# Patient Record
Sex: Male | Born: 1985 | Race: White | Hispanic: No | Marital: Single | Smoking: Current every day smoker
Health system: Southern US, Community
[De-identification: ages and names within clinical notes are randomized; demographics above are authoritative.]

## PROBLEM LIST (undated history)

## (undated) DIAGNOSIS — S82899A Other fracture of unspecified lower leg, initial encounter for closed fracture: Secondary | ICD-10-CM

## (undated) DIAGNOSIS — S40812A Abrasion of left upper arm, initial encounter: Secondary | ICD-10-CM

## (undated) DIAGNOSIS — K219 Gastro-esophageal reflux disease without esophagitis: Secondary | ICD-10-CM

---
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
## 2005-10-07 ENCOUNTER — Emergency Department (HOSPITAL_COMMUNITY): Admission: EM | Admit: 2005-10-07 | Discharge: 2005-10-08 | Payer: Self-pay | Admitting: Emergency Medicine

## 2009-01-15 ENCOUNTER — Emergency Department (HOSPITAL_COMMUNITY): Admission: EM | Admit: 2009-01-15 | Discharge: 2009-01-15 | Payer: Self-pay | Admitting: Emergency Medicine

## 2011-09-06 DIAGNOSIS — S82899A Other fracture of unspecified lower leg, initial encounter for closed fracture: Secondary | ICD-10-CM

## 2011-09-06 HISTORY — DX: Other fracture of unspecified lower leg, initial encounter for closed fracture: S82.899A

## 2011-09-07 ENCOUNTER — Emergency Department (HOSPITAL_COMMUNITY): Payer: BC Managed Care – PPO

## 2011-09-07 ENCOUNTER — Emergency Department (HOSPITAL_COMMUNITY)
Admission: EM | Admit: 2011-09-07 | Discharge: 2011-09-07 | Disposition: A | Discharge status: Home / Self Care | Payer: BC Managed Care – PPO | Attending: Emergency Medicine | Admitting: Emergency Medicine

## 2011-09-07 ENCOUNTER — Encounter (HOSPITAL_COMMUNITY): Payer: Self-pay | Admitting: Emergency Medicine

## 2011-09-07 DIAGNOSIS — Y9301 Activity, walking, marching and hiking: Secondary | ICD-10-CM | POA: Insufficient documentation

## 2011-09-07 DIAGNOSIS — K219 Gastro-esophageal reflux disease without esophagitis: Secondary | ICD-10-CM | POA: Insufficient documentation

## 2011-09-07 DIAGNOSIS — S82843A Displaced bimalleolar fracture of unspecified lower leg, initial encounter for closed fracture: Secondary | ICD-10-CM

## 2011-09-07 DIAGNOSIS — W108XXA Fall (on) (from) other stairs and steps, initial encounter: Secondary | ICD-10-CM | POA: Insufficient documentation

## 2011-09-07 DIAGNOSIS — Y998 Other external cause status: Secondary | ICD-10-CM | POA: Insufficient documentation

## 2011-09-07 DIAGNOSIS — F17200 Nicotine dependence, unspecified, uncomplicated: Secondary | ICD-10-CM | POA: Insufficient documentation

## 2011-09-07 HISTORY — DX: Gastro-esophageal reflux disease without esophagitis: K21.9

## 2011-09-07 MED ORDER — PERCOCET 5-325 MG PO TABS: ORAL_TABLET | ORAL | Status: DC

## 2011-09-07 MED ORDER — OXYCODONE-ACETAMINOPHEN 5-325 MG PO TABS
2.0000 | ORAL_TABLET | Freq: Once | ORAL | Status: AC
  Administered 2011-09-07: 2 via ORAL
  Filled 2011-09-07: qty 2

## 2011-09-07 NOTE — ED Notes (Signed)
Pt states he fell down some wooden steps tonight around 10pm  Pt is c/o left ankle pain  Pt has swelling and abrasions noted to his left ankle and arm   Pt denies pain anywhere else other than his ankle

## 2011-09-07 NOTE — ED Notes (Signed)
Ice pack given to pt for comfort

## 2011-09-07 NOTE — ED Provider Notes (Cosign Needed)
History     CSN: [id number]  Arrival date & time 09/07/11  Nicholas Christensen   First MD Initiated Contact with Patient 09/07/11 0103      Chief Complaint  Patient presents with  . Ankle Pain    (Consider location/radiation/quality/duration/timing/severity/associated sxs/prior treatment) HPI  Patient relates he was coming down 3 steps and he tripped and fell injuring his left ankle tonight about 10:30 PM. He denies hitting his head or loss of consciousness. He notes that he has an abrasion on his left forearm but he has no pain there. He states he has a lot of pain and swelling in his left ankle.  Patient reports he is seeing Dr. Darrelyn Hillock  in the past and his father also sees him. They request him if he needs orthopedic care.  PCP none  Past Medical History  Diagnosis Date  . Acid reflux     History reviewed. No pertinent past surgical history.  Family History  Problem Relation Age of Onset  . Diabetes Mother   . Hypertension Father   . Cancer Other   . Stroke Other     History  Substance Use Topics  . Smoking status: Current Everyday Smoker    Types: Cigarettes  . Smokeless tobacco: Not on file  . Alcohol Use: Yes  employed    Review of Systems  All other systems reviewed and are negative.    Allergies  Review of patient's allergies indicates no known allergies.  Home Medications   Current Outpatient Rx  Name Route Sig Dispense Refill  . DIHYDROXYALUMINUM SOD CARB 334 MG PO CHEW Oral Chew 1 tablet by mouth 2 (two) times daily as needed. For heartburn      BP 146/91  Pulse 105  Temp 98 F (36.7 C) (Oral)  Resp 18  SpO2 100%  Vital signs normal except tachycardia   Physical Exam  Nursing note and vitals reviewed. Constitutional: He is oriented to person, place, and time. He appears well-developed and well-nourished.  Non-toxic appearance. He does not appear ill. No distress.  HENT:  Head: Normocephalic and atraumatic.  Nose: Nose normal. No mucosal edema  or rhinorrhea.  Mouth/Throat: Mucous membranes are normal. No dental abscesses or uvula swelling.  Eyes: Conjunctivae and EOM are normal. Pupils are equal, round, and reactive to light.  Neck: Normal range of motion and full passive range of motion without pain. Neck supple.  Pulmonary/Chest: Effort normal. No respiratory distress. He has no rhonchi. He exhibits no crepitus.  Abdominal: Normal appearance.  Musculoskeletal: Normal range of motion. He exhibits edema and tenderness.       Patient is noted to have some superficial abrasions on his left forearm from his fall. He has no pain on range of motion of his elbow or wrist. Patient's noted to have moderate swelling of his left ankle both medial and laterally. He has intact pulses. He has normal color of his toes with normal capillary refill. He is nontender to palpation in his knee or his lower leg. He has a superficial abrasion over the lateral malleolus.  Neurological: He is alert and oriented to person, place, and time. He has normal strength. No cranial nerve deficit.  Skin: Skin is warm, dry and intact. No rash noted. No erythema. No pallor.  Psychiatric: He has a normal mood and affect. His speech is normal and behavior is normal. His mood appears not anxious.    ED Course  Procedures (including critical care time)   Medications  oxyCODONE-acetaminophen (PERCOCET/ROXICET) 5-325  MG per tablet 2 tablet (2 tablet Oral Given 09/07/11 0203)   Pt placed in post/stirrup splint and crutches  02:01 Dr Darrelyn Hillock put in well padded splint and come to the office around 9 am today.   Labs Reviewed - No data to display Dg Ankle Complete Left  09/07/2011  *RADIOLOGY REPORT*  Clinical Data: Fall  LEFT ANKLE COMPLETE - 3+ VIEW  Comparison: None.  Findings: There is an oblique fracture through the lateral malleolus, extending from the tibial plafond posteriorly and superiorly.  There is widening of the medial ankle mortise. Displaced intra-articular  posterior malleolar fracture.  Medial malleolus is intact.  IMPRESSION: Acute fractures of the lateral malleolus and posterior malleolus are associated with disruption of the ankle mortise.  Original Report Authenticated By: Donavan Burnet, M.D.     1. Bimalleolar ankle fracture    New Prescriptions   PERCOCET 5-325 MG PER TABLET    Take 1 or 2 po Q 6hrs for pain    Plan discharge  Devoria Albe, MD, FACEP   MDM          Ward Givens, MD 09/07/11 [phone]

## 2011-09-09 ENCOUNTER — Ambulatory Visit
Admission: RE | Admit: 2011-09-09 | Discharge: 2011-09-09 | Disposition: A | Discharge status: Home / Self Care | Payer: BC Managed Care – PPO | Source: Ambulatory Visit | Attending: Orthopedic Surgery | Admitting: Orthopedic Surgery

## 2011-09-09 ENCOUNTER — Other Ambulatory Visit: Payer: Self-pay | Admitting: Orthopedic Surgery

## 2011-09-09 ENCOUNTER — Encounter (HOSPITAL_COMMUNITY): Payer: Self-pay | Admitting: Pharmacy Technician

## 2011-09-09 DIAGNOSIS — T148XXA Other injury of unspecified body region, initial encounter: Secondary | ICD-10-CM

## 2011-09-10 ENCOUNTER — Encounter (HOSPITAL_COMMUNITY)
Admission: RE | Admit: 2011-09-10 | Discharge: 2011-09-10 | Disposition: A | Discharge status: Home / Self Care | Payer: BC Managed Care – PPO | Source: Ambulatory Visit | Attending: Orthopedic Surgery | Admitting: Orthopedic Surgery

## 2011-09-10 ENCOUNTER — Encounter (HOSPITAL_COMMUNITY): Payer: Self-pay

## 2011-09-10 HISTORY — DX: Other fracture of unspecified lower leg, initial encounter for closed fracture: S82.899A

## 2011-09-10 HISTORY — DX: Abrasion of left upper arm, initial encounter: S40.812A

## 2011-09-10 LAB — URINALYSIS, ROUTINE W REFLEX MICROSCOPIC
Glucose, UA: NEGATIVE mg/dL
Hgb urine dipstick: NEGATIVE
Ketones, ur: NEGATIVE mg/dL
Leukocytes, UA: NEGATIVE
Nitrite: NEGATIVE
Protein, ur: NEGATIVE mg/dL
Specific Gravity, Urine: 1.04 — ABNORMAL HIGH (ref 1.005–1.030)
Urobilinogen, UA: 1 mg/dL (ref 0.0–1.0)
pH: 6 (ref 5.0–8.0)

## 2011-09-10 LAB — COMPREHENSIVE METABOLIC PANEL
ALT: 22 U/L (ref 0–53)
AST: 25 U/L (ref 0–37)
Albumin: 3.7 g/dL (ref 3.5–5.2)
Alkaline Phosphatase: 60 U/L (ref 39–117)
BUN: 15 mg/dL (ref 6–23)
CO2: 28 mEq/L (ref 19–32)
Calcium: 9.4 mg/dL (ref 8.4–10.5)
Chloride: 99 mEq/L (ref 96–112)
Creatinine, Ser: 1.04 mg/dL (ref 0.50–1.35)
GFR calc Af Amer: >90 mL/min (ref >90)
GFR calc non Af Amer: >90 mL/min (ref >90)
Glucose, Bld: 89 mg/dL (ref 70–99)
Potassium: 4.3 mEq/L (ref 3.5–5.1)
Sodium: 137 mEq/L (ref 135–145)
Total Bilirubin: 0.3 mg/dL (ref 0.3–1.2)
Total Protein: 7.3 g/dL (ref 6.0–8.3)

## 2011-09-10 LAB — CBC
MCV: 97.5 fL (ref 78.0–100.0)
Platelets: 200 10*3/uL (ref 150–400)
RBC: 4.82 MIL/uL (ref 4.22–5.81)
RDW: 12.9 % (ref 11.5–15.5)
WBC: 9.1 10*3/uL (ref 4.0–10.5)

## 2011-09-10 LAB — TYPE AND SCREEN
ABO/RH(D): A POS
Antibody Screen: NEGATIVE

## 2011-09-10 LAB — ABO/RH: ABO/RH(D): A POS

## 2011-09-10 LAB — SURGICAL PCR SCREEN: MRSA, PCR: NEGATIVE

## 2011-09-10 LAB — APTT: aPTT: 27 seconds (ref 24–37)

## 2011-09-10 LAB — PROTIME-INR
INR: 1.04 (ref 0.00–1.49)
Prothrombin Time: 13.8 seconds (ref 11.6–15.2)

## 2011-09-10 NOTE — Patient Instructions (Signed)
20 Nicholas Christensen  09/10/2011   Your procedure is scheduled on:  09/11/11 AT 10:00 AM  Report to SHORT STAY DEPT  at 8:00 AM.  Call this number if you have problems the morning of surgery: [phone]   Remember:   Do not eat food or drink liquids AFTER MIDNIGHT    Take these medicines the morning of surgery with A SIP OF WATER: HYDROCODONE IF NEEDED   Do not wear jewelry, make-up or nail polish.  Do not wear lotions, powders, or perfumes.   Do not shave legs or underarms 48 hrs. before surgery (men may shave face)  Do not bring valuables to the hospital.  Contacts, dentures or bridgework may not be worn into surgery.  Leave suitcase in the car. After surgery it may be brought to your room.  For patients admitted to the hospital, checkout time is 11:00 AM the day of discharge.   Patients discharged the day of surgery will not be allowed to drive home.    Special Instructions:   Please read over the following fact sheets that you were given: MRSA  Information / Incentive Spirometer               SHOWER WITH BETASEPT THE NIGHT BEFORE SURGERY AND THE MORNING OF SURGERY

## 2011-09-11 ENCOUNTER — Encounter (HOSPITAL_COMMUNITY): Payer: Self-pay | Admitting: *Deleted

## 2011-09-11 ENCOUNTER — Ambulatory Visit (HOSPITAL_COMMUNITY): Payer: BC Managed Care – PPO

## 2011-09-11 ENCOUNTER — Encounter (HOSPITAL_COMMUNITY): Payer: Self-pay | Admitting: Anesthesiology

## 2011-09-11 ENCOUNTER — Encounter (HOSPITAL_COMMUNITY)
Admission: RE | Disposition: A | Discharge status: Home / Self Care | Payer: Self-pay | Source: Ambulatory Visit | Attending: Orthopedic Surgery

## 2011-09-11 ENCOUNTER — Inpatient Hospital Stay (HOSPITAL_COMMUNITY)
Admission: RE | Admit: 2011-09-11 | Discharge: 2011-09-12 | DRG: 219 | Disposition: A | Discharge status: Home / Self Care | Payer: BC Managed Care – PPO | Source: Ambulatory Visit | Attending: Orthopedic Surgery | Admitting: Orthopedic Surgery

## 2011-09-11 ENCOUNTER — Ambulatory Visit (HOSPITAL_COMMUNITY): Payer: BC Managed Care – PPO | Admitting: Anesthesiology

## 2011-09-11 DIAGNOSIS — S93439A Sprain of tibiofibular ligament of unspecified ankle, initial encounter: Secondary | ICD-10-CM | POA: Diagnosis present

## 2011-09-11 DIAGNOSIS — Z79899 Other long term (current) drug therapy: Secondary | ICD-10-CM

## 2011-09-11 DIAGNOSIS — S82843A Displaced bimalleolar fracture of unspecified lower leg, initial encounter for closed fracture: Principal | ICD-10-CM | POA: Diagnosis present

## 2011-09-11 DIAGNOSIS — F17200 Nicotine dependence, unspecified, uncomplicated: Secondary | ICD-10-CM | POA: Diagnosis present

## 2011-09-11 DIAGNOSIS — K219 Gastro-esophageal reflux disease without esophagitis: Secondary | ICD-10-CM | POA: Diagnosis present

## 2011-09-11 DIAGNOSIS — W19XXXA Unspecified fall, initial encounter: Secondary | ICD-10-CM | POA: Diagnosis present

## 2011-09-11 DIAGNOSIS — M25579 Pain in unspecified ankle and joints of unspecified foot: Secondary | ICD-10-CM | POA: Diagnosis present

## 2011-09-11 DIAGNOSIS — S82842A Displaced bimalleolar fracture of left lower leg, initial encounter for closed fracture: Secondary | ICD-10-CM

## 2011-09-11 DIAGNOSIS — Z01812 Encounter for preprocedural laboratory examination: Secondary | ICD-10-CM

## 2011-09-11 HISTORY — PX: ORIF ANKLE FRACTURE: SHX5408

## 2011-09-11 SURGERY — OPEN REDUCTION INTERNAL FIXATION (ORIF) ANKLE FRACTURE
Anesthesia: General | Site: Ankle | Laterality: Left | Wound class: Clean

## 2011-09-11 MED ORDER — LACTATED RINGERS IV SOLN: INTRAVENOUS | Status: DC

## 2011-09-11 MED ORDER — ONDANSETRON HCL 4 MG PO TABS: 4.0000 mg | ORAL_TABLET | Freq: Four times a day (QID) | ORAL | Status: DC | PRN

## 2011-09-11 MED ORDER — CEFAZOLIN SODIUM 1-5 GM-% IV SOLN
1.0000 g | Freq: Four times a day (QID) | INTRAVENOUS | Status: AC
  Administered 2011-09-11 (×2): 1 g via INTRAVENOUS
  Filled 2011-09-11 (×2): qty 50

## 2011-09-11 MED ORDER — BACITRACIN-NEOMYCIN-POLYMYXIN OINTMENT TUBE
TOPICAL_OINTMENT | CUTANEOUS | Status: DC | PRN
  Administered 2011-09-11: 1 via TOPICAL

## 2011-09-11 MED ORDER — CEFAZOLIN SODIUM-DEXTROSE 2-3 GM-% IV SOLR
2.0000 g | INTRAVENOUS | Status: AC
  Administered 2011-09-11: 2 g via INTRAVENOUS

## 2011-09-11 MED ORDER — PROPOFOL 10 MG/ML IV EMUL
INTRAVENOUS | Status: DC | PRN
  Administered 2011-09-11: 200 mg via INTRAVENOUS

## 2011-09-11 MED ORDER — PHENOL 1.4 % MT LIQD
1.0000 | OROMUCOSAL | Status: DC | PRN
  Filled 2011-09-11: qty 177

## 2011-09-11 MED ORDER — DEXAMETHASONE SODIUM PHOSPHATE 4 MG/ML IJ SOLN
INTRAMUSCULAR | Status: DC | PRN
  Administered 2011-09-11: 10 mg via INTRAVENOUS

## 2011-09-11 MED ORDER — LIDOCAINE HCL (CARDIAC) 20 MG/ML IV SOLN
INTRAVENOUS | Status: DC | PRN
  Administered 2011-09-11: 100 mg via INTRAVENOUS

## 2011-09-11 MED ORDER — ACETAMINOPHEN 10 MG/ML IV SOLN
INTRAVENOUS | Status: DC | PRN
  Administered 2011-09-11: 1000 mg via INTRAVENOUS

## 2011-09-11 MED ORDER — SODIUM CHLORIDE 0.9 % IR SOLN
Status: DC | PRN
  Administered 2011-09-11: 11:00:00

## 2011-09-11 MED ORDER — SODIUM CHLORIDE 0.9 % IJ SOLN
INTRAMUSCULAR | Status: DC | PRN
  Administered 2011-09-11: 50 mL

## 2011-09-11 MED ORDER — LACTATED RINGERS IV SOLN
INTRAVENOUS | Status: DC
  Administered 2011-09-11: 11:00:00 via INTRAVENOUS
  Administered 2011-09-11: 1000 mL via INTRAVENOUS

## 2011-09-11 MED ORDER — MIDAZOLAM HCL 5 MG/5ML IJ SOLN
INTRAMUSCULAR | Status: DC | PRN
  Administered 2011-09-11: 2 mg via INTRAVENOUS

## 2011-09-11 MED ORDER — HYDROMORPHONE HCL PF 1 MG/ML IJ SOLN
1.0000 mg | INTRAMUSCULAR | Status: DC | PRN
  Administered 2011-09-11 – 2011-09-12 (×3): 1 mg via INTRAVENOUS
  Filled 2011-09-11 (×3): qty 1

## 2011-09-11 MED ORDER — ONDANSETRON HCL 4 MG/2ML IJ SOLN: 4.0000 mg | Freq: Four times a day (QID) | INTRAMUSCULAR | Status: DC | PRN

## 2011-09-11 MED ORDER — PROMETHAZINE HCL 25 MG/ML IJ SOLN: 6.2500 mg | INTRAMUSCULAR | Status: DC | PRN

## 2011-09-11 MED ORDER — FENTANYL CITRATE 0.05 MG/ML IJ SOLN
INTRAMUSCULAR | Status: DC | PRN
  Administered 2011-09-11 (×3): 50 ug via INTRAVENOUS
  Administered 2011-09-11: 100 ug via INTRAVENOUS
  Administered 2011-09-11 (×2): 50 ug via INTRAVENOUS

## 2011-09-11 MED ORDER — METHOCARBAMOL 100 MG/ML IJ SOLN
500.0000 mg | Freq: Four times a day (QID) | INTRAVENOUS | Status: DC | PRN
  Filled 2011-09-11: qty 5

## 2011-09-11 MED ORDER — HYDROMORPHONE HCL PF 1 MG/ML IJ SOLN
INTRAMUSCULAR | Status: DC | PRN
  Administered 2011-09-11 (×2): 1 mg via INTRAVENOUS

## 2011-09-11 MED ORDER — METHOCARBAMOL 500 MG PO TABS
500.0000 mg | ORAL_TABLET | Freq: Four times a day (QID) | ORAL | Status: DC | PRN
  Administered 2011-09-11 – 2011-09-12 (×2): 500 mg via ORAL
  Filled 2011-09-11 (×2): qty 1

## 2011-09-11 MED ORDER — ACETAMINOPHEN 650 MG RE SUPP: 650.0000 mg | Freq: Four times a day (QID) | RECTAL | Status: DC | PRN

## 2011-09-11 MED ORDER — ALUM & MAG HYDROXIDE-SIMETH 200-200-20 MG/5ML PO SUSP: 30.0000 mL | ORAL | Status: DC | PRN

## 2011-09-11 MED ORDER — ACETAMINOPHEN 325 MG PO TABS: 650.0000 mg | ORAL_TABLET | Freq: Four times a day (QID) | ORAL | Status: DC | PRN

## 2011-09-11 MED ORDER — HYDROMORPHONE HCL PF 1 MG/ML IJ SOLN
INTRAMUSCULAR | Status: AC
  Administered 2011-09-11: 1 mg via INTRAVENOUS
  Filled 2011-09-11: qty 1

## 2011-09-11 MED ORDER — BISACODYL 10 MG RE SUPP: 10.0000 mg | Freq: Every day | RECTAL | Status: DC | PRN

## 2011-09-11 MED ORDER — RIVAROXABAN 10 MG PO TABS
10.0000 mg | ORAL_TABLET | Freq: Every day | ORAL | Status: DC
  Administered 2011-09-12: 10 mg via ORAL
  Filled 2011-09-11 (×2): qty 1

## 2011-09-11 MED ORDER — ACETAMINOPHEN 10 MG/ML IV SOLN
INTRAVENOUS | Status: AC
  Filled 2011-09-11: qty 100

## 2011-09-11 MED ORDER — ONDANSETRON HCL 4 MG/2ML IJ SOLN
INTRAMUSCULAR | Status: DC | PRN
  Administered 2011-09-11: 4 mg via INTRAVENOUS

## 2011-09-11 MED ORDER — CEFAZOLIN SODIUM-DEXTROSE 2-3 GM-% IV SOLR
INTRAVENOUS | Status: AC
  Filled 2011-09-11: qty 50

## 2011-09-11 MED ORDER — OXYCODONE-ACETAMINOPHEN 5-325 MG PO TABS
2.0000 | ORAL_TABLET | ORAL | Status: DC | PRN
  Administered 2011-09-11 – 2011-09-12 (×4): 2 via ORAL
  Filled 2011-09-11 (×4): qty 2

## 2011-09-11 MED ORDER — MENTHOL 3 MG MT LOZG
1.0000 | LOZENGE | OROMUCOSAL | Status: DC | PRN
  Filled 2011-09-11: qty 9

## 2011-09-11 MED ORDER — LACTATED RINGERS IV SOLN
INTRAVENOUS | Status: DC
  Administered 2011-09-11: 100 mL/h via INTRAVENOUS
  Administered 2011-09-12: 02:00:00 via INTRAVENOUS

## 2011-09-11 MED ORDER — HYDROMORPHONE HCL PF 1 MG/ML IJ SOLN
0.2500 mg | INTRAMUSCULAR | Status: DC | PRN
  Administered 2011-09-11 (×2): 0.5 mg via INTRAVENOUS

## 2011-09-11 MED ORDER — BUPIVACAINE LIPOSOME 1.3 % IJ SUSP
20.0000 mL | INTRAMUSCULAR | Status: AC
  Administered 2011-09-11: 20 mL
  Filled 2011-09-11: qty 20

## 2011-09-11 MED ORDER — POLYETHYLENE GLYCOL 3350 17 G PO PACK
17.0000 g | PACK | Freq: Every day | ORAL | Status: DC | PRN
  Filled 2011-09-11: qty 1

## 2011-09-11 SURGICAL SUPPLY — 49 items
BAG SPEC THK2 15X12 ZIP CLS (MISCELLANEOUS) ×1
BAG ZIPLOCK 12X15 (MISCELLANEOUS) ×2 IMPLANT
BANDAGE ELASTIC 4 VELCRO ST LF (GAUZE/BANDAGES/DRESSINGS) ×2 IMPLANT
BANDAGE ELASTIC 6 VELCRO ST LF (GAUZE/BANDAGES/DRESSINGS) ×2 IMPLANT
BANDAGE GAUZE ELAST BULKY 4 IN (GAUZE/BANDAGES/DRESSINGS) ×2 IMPLANT
BIT DRILL 2.5X2.75 QC CALB (BIT) ×2 IMPLANT
BIT DRILL 2.9X70 QC CALB (BIT) ×2 IMPLANT
BIT DRILL 3.5X5.5 QC CALB (BIT) ×2 IMPLANT
CLOTH BEACON ORANGE TIMEOUT ST (SAFETY) ×2 IMPLANT
CUFF TOURN SGL QUICK 34 (TOURNIQUET CUFF) ×2
CUFF TRNQT CYL 34X4X40X1 (TOURNIQUET CUFF) ×1 IMPLANT
DRAPE C-ARM 42X72 X-RAY (DRAPES) ×2 IMPLANT
DRAPE U-SHAPE 47X51 STRL (DRAPES) ×2 IMPLANT
DRSG ADAPTIC 3X8 NADH LF (GAUZE/BANDAGES/DRESSINGS) ×2 IMPLANT
DRSG EMULSION OIL 3X16 NADH (GAUZE/BANDAGES/DRESSINGS) ×2 IMPLANT
DRSG PAD ABDOMINAL 8X10 ST (GAUZE/BANDAGES/DRESSINGS) ×6 IMPLANT
DURAPREP 26ML APPLICATOR (WOUND CARE) ×2 IMPLANT
ELECT REM PT RETURN 9FT ADLT (ELECTROSURGICAL) ×2
ELECTRODE REM PT RTRN 9FT ADLT (ELECTROSURGICAL) ×1 IMPLANT
FIXATION ZIPTIGHT ANKLE SNDSMS (Ankle) ×1 IMPLANT
GLOVE BIOGEL PI IND STRL 8 (GLOVE) ×1 IMPLANT
GLOVE BIOGEL PI IND STRL 8.5 (GLOVE) ×1 IMPLANT
GLOVE BIOGEL PI INDICATOR 8 (GLOVE) ×1
GLOVE BIOGEL PI INDICATOR 8.5 (GLOVE) ×1
GLOVE ECLIPSE 8.0 STRL XLNG CF (GLOVE) ×4 IMPLANT
GOWN PREVENTION PLUS LG XLONG (DISPOSABLE) ×4 IMPLANT
GOWN STRL REIN XL XLG (GOWN DISPOSABLE) ×4 IMPLANT
MANIFOLD NEPTUNE II (INSTRUMENTS) ×2 IMPLANT
PACK LOWER EXTREMITY WL (CUSTOM PROCEDURE TRAY) ×2 IMPLANT
PAD CAST 4YDX4 CTTN HI CHSV (CAST SUPPLIES) ×4 IMPLANT
PADDING CAST COTTON 4X4 STRL (CAST SUPPLIES) ×8
PADDING CAST COTTON 6X4 STRL (CAST SUPPLIES) ×4 IMPLANT
PLATE ACE 100DEG 7HOLE (Plate) ×2 IMPLANT
POSITIONER SURGICAL ARM (MISCELLANEOUS) ×2 IMPLANT
SCOTCHCAST PLUS 4X4 WHITE (CAST SUPPLIES) ×4 IMPLANT
SCREW CORTICAL 3.5MM  12MM (Screw) ×2 IMPLANT
SCREW CORTICAL 3.5MM 12MM (Screw) ×2 IMPLANT
SCREW CORTICAL 3.5MM 14MM (Screw) ×2 IMPLANT
SCREW NLOCK CANC HEX 4X16 (Screw) ×4 IMPLANT
SCREW NLOCK CANC HEX 4X20 (Screw) ×2 IMPLANT
SPONGE GAUZE 4X4 12PLY (GAUZE/BANDAGES/DRESSINGS) ×2 IMPLANT
SPONGE LAP 4X18 X RAY DECT (DISPOSABLE) ×2 IMPLANT
SUT ETHIBOND NAB CT1 #1 30IN (SUTURE) ×2 IMPLANT
SUT VIC AB 0 CT1 27 (SUTURE) ×2
SUT VIC AB 0 CT1 27XBRD ANTBC (SUTURE) ×2 IMPLANT
SUT VIC AB 2-0 CT1 27 (SUTURE) ×2
SUT VIC AB 2-0 CT1 TAPERPNT 27 (SUTURE) ×1 IMPLANT
TOWEL OR 17X26 10 PK STRL BLUE (TOWEL DISPOSABLE) ×4 IMPLANT
ZIPTIGHT ANKLE SYNODESMOSS FIX (Ankle) ×2 IMPLANT

## 2011-09-11 NOTE — Transfer of Care (Signed)
Immediate Anesthesia Transfer of Care Note  Patient: Nicholas Christensen  Procedure(s) Performed: Procedure(s) (LRB): OPEN REDUCTION INTERNAL FIXATION (ORIF) ANKLE FRACTURE (Left)  Patient Location: PACU  Anesthesia Type: General  Level of Consciousness: awake, alert  and oriented  Airway & Oxygen Therapy: Patient Spontanous Breathing and Patient connected to face mask oxygen  Post-op Assessment: Report given to PACU RN and Post -op Vital signs reviewed and stable  Post vital signs: Reviewed and stable  Complications: No apparent anesthesia complications

## 2011-09-11 NOTE — H&P (Signed)
Nicholas Christensen is an 26 y.o. male.   Chief Complaint: Pain in Left Ankle  Past Medical History  Diagnosis Date  . Acid reflux   . Ankle fracture 09-06-11    LEFT  . Abrasion of arm, left     ALSO L LEG    History reviewed. No pertinent past surgical history.  Family History  Problem Relation Age of Onset  . Diabetes Mother   . Hypertension Father   . Cancer Other   . Stroke Other    Social History:  reports that he has been smoking Cigarettes.  He has been smoking about 1 pack per day. He does not have any smokeless tobacco history on file. He reports that he drinks alcohol. He reports that he does not use illicit drugs.  Allergies: No Known Allergies  Medications Prior to Admission  Medication Sig Dispense Refill  . calcium carbonate-magnesium hydroxide (ROLAIDS) 334 MG CHEW Chew 1 tablet by mouth 2 (two) times daily as needed. For heartburn      . oxyCODONE-acetaminophen (PERCOCET) 5-325 MG per tablet Take 1-2 tablets by mouth every 6 (six) hours as needed. For pain        Results for orders placed during the hospital encounter of 09/10/11 (from the past 48 hour(s))  SURGICAL PCR SCREEN     Status: Normal   Collection Time   09/10/11  1:00 PM      Component Value Range Comment   MRSA, PCR NEGATIVE  NEGATIVE    Staphylococcus aureus NEGATIVE  NEGATIVE   URINALYSIS, ROUTINE W REFLEX MICROSCOPIC     Status: Abnormal   Collection Time   09/10/11  1:01 PM      Component Value Range Comment   Color, Urine AMBER (*) YELLOW BIOCHEMICALS MAY BE AFFECTED BY COLOR   APPearance CLEAR  CLEAR    Specific Gravity, Urine 1.040 (*) 1.005 - 1.030    pH 6.0  5.0 - 8.0    Glucose, UA NEGATIVE  NEGATIVE mg/dL    Hgb urine dipstick NEGATIVE  NEGATIVE    Bilirubin Urine SMALL (*) NEGATIVE    Ketones, ur NEGATIVE  NEGATIVE mg/dL    Protein, ur NEGATIVE  NEGATIVE mg/dL    Urobilinogen, UA 1.0  0.0 - 1.0 mg/dL    Nitrite NEGATIVE  NEGATIVE    Leukocytes, UA NEGATIVE  NEGATIVE  MICROSCOPIC NOT DONE ON URINES WITH NEGATIVE PROTEIN, BLOOD, LEUKOCYTES, NITRITE, OR GLUCOSE <1000 mg/dL.  APTT     Status: Normal   Collection Time   09/10/11  1:55 PM      Component Value Range Comment   aPTT 27  24 - 37 seconds   COMPREHENSIVE METABOLIC PANEL     Status: Normal   Collection Time   09/10/11  1:55 PM      Component Value Range Comment   Sodium 137  135 - 145 mEq/L    Potassium 4.3  3.5 - 5.1 mEq/L    Chloride 99  96 - 112 mEq/L    CO2 28  19 - 32 mEq/L    Glucose, Bld 89  70 - 99 mg/dL    BUN 15  6 - 23 mg/dL    Creatinine, Ser 1.61  0.50 - 1.35 mg/dL    Calcium 9.4  8.4 - 09.6 mg/dL    Total Protein 7.3  6.0 - 8.3 g/dL    Albumin 3.7  3.5 - 5.2 g/dL    AST 25  0 - 37 U/L  ALT 22  0 - 53 U/L    Alkaline Phosphatase 60  39 - 117 U/L    Total Bilirubin 0.3  0.3 - 1.2 mg/dL    GFR calc non Af Amer >90  >90 mL/min    GFR calc Af Amer >90  >90 mL/min   PROTIME-INR     Status: Normal   Collection Time   09/10/11  1:55 PM      Component Value Range Comment   Prothrombin Time 13.8  11.6 - 15.2 seconds    INR 1.04  0.00 - 1.49   CBC     Status: Abnormal   Collection Time   09/10/11  1:55 PM      Component Value Range Comment   WBC 9.1  4.0 - 10.5 K/uL    RBC 4.82  4.22 - 5.81 MIL/uL    Hemoglobin 16.6  13.0 - 17.0 g/dL    HCT 16.1  09.6 - 04.5 %    MCV 97.5  78.0 - 100.0 fL    MCH 34.4 (*) 26.0 - 34.0 pg    MCHC 35.3  30.0 - 36.0 g/dL    RDW 40.9  81.1 - 91.4 %    Platelets 200  150 - 400 K/uL   ABO/RH     Status: Normal   Collection Time   09/10/11  1:55 PM      Component Value Range Comment   ABO/RH(D) A POS     TYPE AND SCREEN     Status: Normal   Collection Time   09/10/11  2:00 PM      Component Value Range Comment   ABO/RH(D) A POS      Antibody Screen NEG      Sample Expiration 09/13/2011      Ct Ankle Left Wo Contrast  09/09/2011  *RADIOLOGY REPORT*  Clinical Data: Status post fall 09/06/2011.  Evaluate bimalleolar fracture for surgical repair  09/11/2011.  CT OF THE LEFT ANKLE WITHOUT CONTRAST  Technique:  Multidetector CT imaging was performed according to the standard protocol. Multiplanar CT image reconstructions were also generated.  Comparison: Radiographs 09/07/2011.  Findings: There is an oblique mildly comminuted fracture of the distal fibular diaphysis extending proximally from the ankle mortise.  This fracture demonstrates 4 mm of posterior displacement.  There is an intra-articular fracture fragment at the distal tibiofibular articulation (axial image 31).  There is a comminuted intra-articular fracture involving the posterior aspect of the tibial plafond.  This extends medially towards the base of the medial malleolus.  The medial malleolus itself is intact.  The talar dome is intact.  There is a small fracture fragment anterolaterally in the ankle joint.  The subtalar joint and calcaneus appear normal.  The visualized bones of the midfoot appear normal.  Soft tissue windows demonstrate swelling surrounding the ankle. The ankle tendons appear intact.  The anterior talofibular ligament appears intact.  IMPRESSION:  1.  Mildly comminuted and displaced fracture of the distal fibular diaphysis with associated small loose body in the distal tibiofibular articulation. 2.  Comminuted intra-articular fracture involving the posterior aspect of the tibial plafond.  Original Report Authenticated By: Gerrianne Scale, M.D.    Review of Systems  Constitutional: Negative.   HENT: Negative.   Eyes: Negative.   Respiratory: Negative.   Cardiovascular: Negative.   Gastrointestinal: Negative.   Genitourinary: Negative.   Musculoskeletal: Negative.        Fracture Fibula on Left,with widening of ankle Mortise.  Skin: Negative.  Neurological: Negative.   Endo/Heme/Allergies: Negative.   Psychiatric/Behavioral: Negative.     Blood pressure 170/93, pulse 100, temperature 97.5 F (36.4 C), resp. rate 24, SpO2 99.00%. Physical Exam    Constitutional: He appears well-developed.  HENT:  Head: Normocephalic.  Eyes: Pupils are equal, round, and reactive to light.  Cardiovascular: Normal rate.   Respiratory: Effort normal.  GI: Soft.  Genitourinary: Penis normal.  Musculoskeletal: Normal range of motion.       Left ankle: He exhibits swelling and deformity. tenderness. Achilles tendon normal.       Feet:  Neurological: He is alert.  Skin: Skin is warm.  Psychiatric: He has a normal mood and affect.   Assessment/Plan Open reduction Left Ankle fracture  Laelle Bridgett A 09/11/2011, 9:03 AM

## 2011-09-11 NOTE — Interval H&P Note (Signed)
History and Physical Interval Note:  09/11/2011 9:17 AM  Nicholas Christensen  has presented today for surgery, with the diagnosis of Left Ankle Fibular Fracture  The various methods of treatment have been discussed with the patient and family. After consideration of risks, benefits and other options for treatment, the patient has consented to  Procedure(s) (LRB): OPEN REDUCTION INTERNAL FIXATION (ORIF) ANKLE FRACTURE (Left) as a surgical intervention .  The patient's history has been reviewed, patient examined, no change in status, stable for surgery.  I have reviewed the patient's chart and labs.  Questions were answered to the patient's satisfaction.     Nicholas Christensen A

## 2011-09-11 NOTE — Anesthesia Postprocedure Evaluation (Signed)
Anesthesia Post Note  Patient: Nicholas Christensen  Procedure(s) Performed: Procedure(s) (LRB): OPEN REDUCTION INTERNAL FIXATION (ORIF) ANKLE FRACTURE (Left)  Anesthesia type: General  Patient location: PACU  Post pain: Pain level controlled  Post assessment: Post-op Vital signs reviewed  Last Vitals:  Filed Vitals:   09/11/11 1145  BP: 170/72  Pulse: 112  Temp:   Resp: 14    Post vital signs: Reviewed  Level of consciousness: sedated  Complications: No apparent anesthesia complications

## 2011-09-11 NOTE — Anesthesia Preprocedure Evaluation (Addendum)
Anesthesia Evaluation  Patient identified by MRN, date of birth, ID band Patient awake    Reviewed: Allergy & Precautions, H&P , NPO status , Patient's Chart, lab work & pertinent test results  Airway Mallampati: II TM Distance: >3 FB     Dental  (+) Teeth Intact, Poor Dentition and Dental Advisory Given   Pulmonary Current Smoker,  breath sounds clear to auscultation  Pulmonary exam normal       Cardiovascular negative cardio ROS  Rhythm:Regular Rate:Normal     Neuro/Psych negative neurological ROS  negative psych ROS   GI/Hepatic Neg liver ROS, GERD-  Medicated,  Endo/Other  negative endocrine ROS  Renal/GU negative Renal ROS  negative genitourinary   Musculoskeletal negative musculoskeletal ROS (+)   Abdominal   Peds  Hematology negative hematology ROS (+)   Anesthesia Other Findings   Reproductive/Obstetrics negative OB ROS                           Anesthesia Physical Anesthesia Plan  ASA: I  Anesthesia Plan: General   Post-op Pain Management:    Induction: Intravenous  Airway Management Planned: LMA  Additional Equipment:   Intra-op Plan:   Post-operative Plan: Extubation in OR  Informed Consent: I have reviewed the patients History and Physical, chart, labs and discussed the procedure including the risks, benefits and alternatives for the proposed anesthesia with the patient or authorized representative who has indicated his/her understanding and acceptance.   Dental advisory given  Plan Discussed with: CRNA  Anesthesia Plan Comments:        Anesthesia Quick Evaluation

## 2011-09-11 NOTE — Brief Op Note (Signed)
09/11/2011  11:39 AM  PATIENT:  Nicholas Christensen  26 y.o. male  PRE-OPERATIVE DIAGNOSIS:  Left Ankle ,BiMalleolar,Closed Fracture,Displaced,with Disruption of ankle Syndesmosis.  POST-OPERATIVE DIAGNOSIS:  Same as Pre-Op Dx  PROCEDURE:  Procedure(s) (LRB): OPEN REDUCTION INTERNAL FIXATION (ORIF) of Bimalleolar ANKLE FRACTURE (Left),with Open Reduction of Disruption of ankle Syndesmosis. SURGEON:  Surgeon(s) and Role:    * Jacki Cones, MD - Primary  PHYSICIAN ASSISTANT: Avel Peace PA     ANESTHESIA:   general  EBL:  Total I/O In: 1000 [I.V.:1000] Out: 50 [Blood:50]  BLOOD ADMINISTERED:none  DRAINS: none   LOCAL MEDICATIONS USED:  BUPIVICAINE 20cc mixed with 10cc of Normal Saline.I used about 15cc of this mixture. SPECIMEN:  No Specimen  DISPOSITION OF SPECIMEN:  N/A  COUNTS:  YES  TOURNIQUET:   Total Tourniquet Time Documented: Thigh (Left) - 65 minutes  DICTATION: .Other Dictation: Dictation Number [id number]  PLAN OF CARE: Admit to inpatient   PATIENT DISPOSITION:  PACU - hemodynamically stable.   Delay start of Pharmacological VTE agent (>24hrs) due to surgical blood loss or risk of bleeding: yes

## 2011-09-11 NOTE — Preoperative (Signed)
Beta Blockers   Reason not to administer Beta Blockers:Not Applicable 

## 2011-09-12 ENCOUNTER — Encounter (HOSPITAL_COMMUNITY): Payer: Self-pay | Admitting: Orthopedic Surgery

## 2011-09-12 MED ORDER — METHOCARBAMOL 500 MG PO TABS: 500.0000 mg | ORAL_TABLET | Freq: Four times a day (QID) | ORAL | Status: AC | PRN

## 2011-09-12 MED ORDER — RIVAROXABAN 10 MG PO TABS: 10.0000 mg | ORAL_TABLET | Freq: Every day | ORAL | Status: AC

## 2011-09-12 MED ORDER — OXYCODONE-ACETAMINOPHEN 5-325 MG PO TABS: 2.0000 | ORAL_TABLET | ORAL | Status: AC | PRN

## 2011-09-12 NOTE — Progress Notes (Signed)
Occupational Therapy Note Chart reviewed. Spoke to pt who states he has been getting up and down from the toilet at home prior to surgery without difficulty. He states his family is going to obtain a tubseat so he can sit and pivot legs around into tub. He reports no concerns with bathing/dressing for home either. Will sign off for OT. Per PT note, pt up in room packing own bags. Judithann Sauger OTR/L [phone] 09/12/2011

## 2011-09-12 NOTE — Progress Notes (Signed)
Physical Therapy Note  Order received and chart reviewed.  Spoke with pt in room and he reports his mobility is good as he has already packed his bags for d/c without assist.  Pt has his own crutches in room and states he practiced stairs prior to surgery.  Pt is aware of NWB status.  Pt reports he has no PT needs at this time.  PT to sign off.  Zenovia Jarred, PT Pager: [phone]

## 2011-09-12 NOTE — Progress Notes (Signed)
Utilization review completed.  

## 2011-09-12 NOTE — Progress Notes (Signed)
Subjective: Doing very well. Circulation intact in foot.   Objective: Vital signs in last 24 hours: Temp:  [97.5 F (36.4 C)-98.9 F (37.2 C)] 98.6 F (37 C) (08/01 0516) Pulse Rate:  [80-112] 80  (08/01 0516) Resp:  [12-24] 17  (08/01 0516) BP: (136-170)/(59-93) 155/87 mmHg (08/01 0516) SpO2:  [94 %-100 %] 96 % (08/01 0516) Weight:  [104.327 kg (230 lb)] 104.327 kg (230 lb) (07/31 1324)  Intake/Output from previous day: 07/31 0701 - 08/01 0700 In: 3350 [P.O.:600; I.V.:2650; IV Piggyback:100] Out: 3350 [Urine:3300; Blood:50] Intake/Output this shift: Total I/O In: -  Out: 500 [Urine:500]   Basename 09/10/11 1355  HGB 16.6    Basename 09/10/11 1355  WBC 9.1  RBC 4.82  HCT 47.0  PLT 200    Basename 09/10/11 1355  NA 137  K 4.3  CL 99  CO2 28  BUN 15  CREATININE 1.04  GLUCOSE 89  CALCIUM 9.4    Basename 09/10/11 1355  LABPT --  INR 1.04    Neurologically intact Neurovascular intact  Assessment/Plan: Will DC today,and office in one week.   Zaim Nitta A 09/12/2011, 7:28 AM

## 2011-09-12 NOTE — Op Note (Signed)
Nicholas Christensen, Nicholas Christensen NO.:  [id number]  MEDICAL RECORD NO.:  [id number]  LOCATION:  1533                         FACILITY:  [facility]  PHYSICIAN:  Georges Lynch. Korrine Sicard, M.D.DATE OF BIRTH:  [birth year]  DATE OF PROCEDURE:  09/11/2011 DATE OF DISCHARGE:                              OPERATIVE REPORT   PREOPERATIVE DIAGNOSES: 1. Bimalleolar fracture, left ankle. 2. Complete disruption of the ankle mortise, left ankle.  POSTOPERATIVE DIAGNOSES: 1. Bimalleolar fracture, left ankle. 2. Complete disruption of the ankle mortise, left ankle.  SURGEON:  Georges Lynch. Adreyan Carbajal, MD.  ASSISTANT:  Alexzandrew L. Perkins, PA-C.  OPERATION: 1. Open reduction and internal fixation of the fibular fracture     utilizing a 1/3 tubular plate, 7 holes and I also prior to applying     the plate, utilized an oblique screw down through the fracture     site. 2. We also did an open reduction and fixation of the syndesmosis     disruption utilizing the Zip-type device for the reduction of the     ankle mortise.  PROCEDURE:  Under general anesthesia, routine orthopedic prep of the initial clipping of the skin was carried out because of the excessive hair on the leg.  Following that, I did an initial prep with a Betadine scrub followed by a DuraPrep scrub.  So, we did 2 separate scrubs. After sterile prep and drape, the appropriate time-out was carried out. I also marked the appropriate leg in the holding area.  At this time, the leg was exsanguinated with an Esmarch.  Tourniquet was elevated to 325 mmHg.  I made a lateral incision over the distal fibula in the usual fashion with great care taken not to injure the underlying superficial sensory nerve.  The incision was carried down directly onto the fibula and the fracture site.  I exposed the fracture site, and did an open reduction of the fracture site, then an oblique screw was placed across the fracture site for fixation purposes.   Following that, I then utilized a 1/3 tubular plate with 7 holes.  I fixed 2 screws distally and then 3 screws proximally.  The 3rd distal hole was utilized after we fixed the fracture.  It was utilized for the syndesmosis reduction.  We did utilize the Zip-type  reduction mechanism by inserting 1st a guide pin through the 3rd drill hole across the syndesmosis.  Following that, I did drill over the guide pin and then removed the device, and then inserted my Zip-type device for compression.  At this particular point, we thoroughly irrigated out the area.  X-rays were taken. We did use the C-arm during the procedure to make sure that the screws are in proper length and we did have a good reduction.  Following that, I thoroughly irrigated out the wound and then closed the wound layers in usual fashion.  I injected about 10 mL of a mixture of Exparel and normal saline.  The skin was closed with 3-0 nylon suture.  Sterile Neosporin dressing was applied.  Then, I put a well-padded short leg cast splint-type device on.  We had a thorough amount of padding up underneath this for compression purposes.  The patient had 2 g  of IV Ancef preop.  Note:  This was a complex read-out procedure.          ______________________________ Georges Lynch. Darrelyn Hillock, M.D.     RAG/MEDQ  D:  09/11/2011  T:  09/12/2011  Job:  [id number]

## 2011-09-12 NOTE — Progress Notes (Signed)
Discharge via wheelchair. Prescription given for robaxin and percocet, and xeralto. Patient states understanding of discharge instructions, has crutches and understands NWB status.

## 2011-09-16 NOTE — Discharge Summary (Signed)
Physician Discharge Summary   Patient ID: Nicholas Christensen MRN: [id number] DOB/AGE: Jan 23, 1986 26 y.o.  Admit date: 09/11/2011 Discharge date: 09/12/2011  Primary Diagnosis:  Bimalleolar fracture left ankle  Admission Diagnoses:  Past Medical History  Diagnosis Date  . Acid reflux   . Ankle fracture 09-06-11    LEFT  . Abrasion of arm, left     ALSO L LEG   Discharge Diagnoses:   Active Problems:  Fracture of ankle, bimalleolar, left, closed  Procedure:  Procedure(s) (LRB): OPEN REDUCTION INTERNAL FIXATION (ORIF) ANKLE FRACTURE (Left)   Consults: None  HPI: Patient relates he was coming down 3 steps and he tripped and fell injuring his left ankle. He denied hitting his head or loss of consciousness. He noted that he had an abrasion on his left forearm but he had no pain there. He stated he has a lot of pain and swelling in his left ankle. X-rays revealed acute fractures of the lateral malleolus and posterior malleolus are associated with disruption of the ankle mortise. Surgical intervetion required.     Laboratory Data: Hospital Outpatient Visit on 09/10/2011  Component Date Value Range Status  . MRSA, PCR 09/10/2011 NEGATIVE  NEGATIVE Final  . Staphylococcus aureus 09/10/2011 NEGATIVE  NEGATIVE Final   Comment:                                 The Xpert SA Assay (FDA                          approved for NASAL specimens                          only), is one component of                          a comprehensive surveillance                          program.  It is not intended                          to diagnose infection nor to                          guide or monitor treatment.  Marland Kitchen aPTT 09/10/2011 27  24 - 37 seconds Final  . Sodium 09/10/2011 137  135 - 145 mEq/L Final  . Potassium 09/10/2011 4.3  3.5 - 5.1 mEq/L Final  . Chloride 09/10/2011 99  96 - 112 mEq/L Final  . CO2 09/10/2011 28  19 - 32 mEq/L Final  . Glucose, Bld 09/10/2011 89  70 - 99 mg/dL Final    . BUN 28/41/3244 15  6 - 23 mg/dL Final  . Creatinine, Ser 09/10/2011 1.04  0.50 - 1.35 mg/dL Final  . Calcium 02/13/7251 9.4  8.4 - 10.5 mg/dL Final  . Total Protein 09/10/2011 7.3  6.0 - 8.3 g/dL Final  . Albumin 66/44/0347 3.7  3.5 - 5.2 g/dL Final  . AST 42/59/5638 25  0 - 37 U/L Final  . ALT 09/10/2011 22  0 - 53 U/L Final  . Alkaline Phosphatase 09/10/2011 60  39 - 117 U/L Final  . Total Bilirubin 09/10/2011 0.3  0.3 - 1.2 mg/dL Final  . GFR calc non Af Amer 09/10/2011 >90  >90 mL/min Final  . GFR calc Af Amer 09/10/2011 >90  >90 mL/min Final   Comment:                                 The eGFR has been calculated                          using the CKD EPI equation.                          This calculation has not been                          validated in all clinical                          situations.                          eGFR's persistently                          <90 mL/min signify                          possible Chronic Kidney Disease.  Marland Kitchen Prothrombin Time 09/10/2011 13.8  11.6 - 15.2 seconds Final  . INR 09/10/2011 1.04  0.00 - 1.49 Final  . Color, Urine 09/10/2011 Nicholas Christensen* YELLOW Final   BIOCHEMICALS MAY BE AFFECTED BY COLOR  . APPearance 09/10/2011 CLEAR  CLEAR Final  . Specific Gravity, Urine 09/10/2011 1.040* 1.005 - 1.030 Final  . pH 09/10/2011 6.0  5.0 - 8.0 Final  . Glucose, UA 09/10/2011 NEGATIVE  NEGATIVE mg/dL Final  . Hgb urine dipstick 09/10/2011 NEGATIVE  NEGATIVE Final  . Bilirubin Urine 09/10/2011 SMALL* NEGATIVE Final  . Ketones, ur 09/10/2011 NEGATIVE  NEGATIVE mg/dL Final  . Protein, ur 09/81/1914 NEGATIVE  NEGATIVE mg/dL Final  . Urobilinogen, UA 09/10/2011 1.0  0.0 - 1.0 mg/dL Final  . Nitrite 78/29/5621 NEGATIVE  NEGATIVE Final  . Leukocytes, UA 09/10/2011 NEGATIVE  NEGATIVE Final   MICROSCOPIC NOT DONE ON URINES WITH NEGATIVE PROTEIN, BLOOD, LEUKOCYTES, NITRITE, OR GLUCOSE <1000 mg/dL.  . WBC 09/10/2011 9.1  4.0 - 10.5 K/uL Final  . RBC  09/10/2011 4.82  4.22 - 5.81 MIL/uL Final  . Hemoglobin 09/10/2011 16.6  13.0 - 17.0 g/dL Final  . HCT 30/86/5784 47.0  39.0 - 52.0 % Final  . MCV 09/10/2011 97.5  78.0 - 100.0 fL Final  . MCH 09/10/2011 34.4* 26.0 - 34.0 pg Final  . MCHC 09/10/2011 35.3  30.0 - 36.0 g/dL Final  . RDW 69/62/9528 12.9  11.5 - 15.5 % Final  . Platelets 09/10/2011 200  150 - 400 K/uL Final  . ABO/RH(D) 09/10/2011 A POS   Final  . Antibody Screen 09/10/2011 NEG   Final  . Sample Expiration 09/10/2011 09/13/2011   Final  . ABO/RH(D) 09/10/2011 A POS   Final    X-Rays:Dg Ankle 2 Views Left  09/11/2011  *RADIOLOGY REPORT*  Clinical Data: Ankle fracture  LEFT ANKLE - 2 VIEW  Comparison: 09/09/2011  Findings: Plate and screws transfixing a distal  fibula fracture. Linear metal object is present in the medial aspect of the distal tibia.  Ankle mortise is anatomic.  IMPRESSION: ORIF distal fibula fracture.  There is anatomic alignment.  Metal object in the distal tibia likely is also due to tibia fracture fixation.  Original Report Authenticated By: Donavan Burnet, M.D.   Dg Ankle Complete Left  09/07/2011  *RADIOLOGY REPORT*  Clinical Data: Fall  LEFT ANKLE COMPLETE - 3+ VIEW  Comparison: None.  Findings: There is an oblique fracture through the lateral malleolus, extending from the tibial plafond posteriorly and superiorly.  There is widening of the medial ankle mortise. Displaced intra-articular posterior malleolar fracture.  Medial malleolus is intact.  IMPRESSION: Acute fractures of the lateral malleolus and posterior malleolus are associated with disruption of the ankle mortise.  Original Report Authenticated By: Donavan Burnet, M.D.   Ct Ankle Left Wo Contrast  09/09/2011  *RADIOLOGY REPORT*  Clinical Data: Status post fall 09/06/2011.  Evaluate bimalleolar fracture for surgical repair 09/11/2011.  CT OF THE LEFT ANKLE WITHOUT CONTRAST  Technique:  Multidetector CT imaging was performed according to the standard  protocol. Multiplanar CT image reconstructions were also generated.  Comparison: Radiographs 09/07/2011.  Findings: There is an oblique mildly comminuted fracture of the distal fibular diaphysis extending proximally from the ankle mortise.  This fracture demonstrates 4 mm of posterior displacement.  There is an intra-articular fracture fragment at the distal tibiofibular articulation (axial image 31).  There is a comminuted intra-articular fracture involving the posterior aspect of the tibial plafond.  This extends medially towards the base of the medial malleolus.  The medial malleolus itself is intact.  The talar dome is intact.  There is a small fracture fragment anterolaterally in the ankle joint.  The subtalar joint and calcaneus appear normal.  The visualized bones of the midfoot appear normal.  Soft tissue windows demonstrate swelling surrounding the ankle. The ankle tendons appear intact.  The anterior talofibular ligament appears intact.  IMPRESSION:  1.  Mildly comminuted and displaced fracture of the distal fibular diaphysis with associated small loose body in the distal tibiofibular articulation. 2.  Comminuted intra-articular fracture involving the posterior aspect of the tibial plafond.  Original Report Authenticated By: Gerrianne Scale, M.D.   Dg C-arm 1-60 Min-no Report  09/11/2011  CLINICAL DATA: ORIF left ankle   C-ARM 1-60 MINUTES  Fluoroscopy was utilized by the requesting physician.  No radiographic  interpretation.      Hospital Course: Patient was admitted to [facility] and taken to the OR and underwent the above state procedure without complications.  Patient tolerated the procedure well and was later transferred to the recovery room and then to the floor for postoperative care.  They were given PO and IV analgesics for pain control following their surgery.  They were given 24 hours of postoperative antibiotics and started on DVT prophylaxis in the form of Xarelto.   PT  discussed use of crutches with patient.  Discharge planning consulted to help with postop disposition and equipment needs.  Incision was healing well.  Patient was seen in rounds and was ready to go home.  Discharge Medications: Prior to Admission medications   Medication Sig Start Date End Date Taking? Authorizing Provider  calcium carbonate-magnesium hydroxide (ROLAIDS) 334 MG CHEW Chew 1 tablet by mouth 2 (two) times daily as needed. For heartburn   Yes Historical Provider, MD  methocarbamol (ROBAXIN) 500 MG tablet Take 1 tablet (500 mg total) by mouth every  6 (six) hours as needed. 09/12/11 09/22/11  Artez Regis Tamala Ser, PA  oxyCODONE-acetaminophen (PERCOCET/ROXICET) 5-325 MG per tablet Take 2 tablets by mouth every 3 (three) hours as needed (Q4-6 hours PRN). 09/12/11 09/22/11  Xylah Early Tamala Ser, PA  rivaroxaban (XARELTO) 10 MG TABS tablet Take 1 tablet (10 mg total) by mouth daily with breakfast. 09/12/11   Schylar Allard Tamala Ser, PA    Diet: Regular diet Activity:NWB Follow-up:in 1 week Disposition - Home Discharged Condition: good   Discharge Orders    Future Orders Please Complete By Expires   Diet general      Call MD / Call 911      Comments:   If you experience chest pain or shortness of breath, CALL 911 and be transported to the hospital emergency room.  If you develope a fever above 101 F, pus (white drainage) or increased drainage or redness at the wound, or calf pain, call your surgeon's office.   Constipation Prevention      Comments:   Drink plenty of fluids.  Prune juice may be helpful.  You may use a stool softener, such as Colace (over the counter) 100 mg twice a day.  Use MiraLax (over the counter) for constipation as needed.   Increase activity slowly as tolerated      Driving restrictions      Comments:   No driving   Discharge instructions      Comments:   Weight bearing as instructed. Call if any temperatures greater than 101 or any wound complications:  [phone] during the day and ask for Dr. Jeannetta Ellis nurse, Mackey Birchwood.     Medication List  As of 09/16/2011 12:55 PM   TAKE these medications         calcium carbonate-magnesium hydroxide 334 MG Chew   Commonly known as: ROLAIDS   Chew 1 tablet by mouth 2 (two) times daily as needed. For heartburn      methocarbamol 500 MG tablet   Commonly known as: ROBAXIN   Take 1 tablet (500 mg total) by mouth every 6 (six) hours as needed.      oxyCODONE-acetaminophen 5-325 MG per tablet   Commonly known as: PERCOCET/ROXICET   Take 2 tablets by mouth every 3 (three) hours as needed (Q4-6 hours PRN).      rivaroxaban 10 MG Tabs tablet   Commonly known as: XARELTO   Take 1 tablet (10 mg total) by mouth daily with breakfast.             Signed: Mauricio Dahlen LAUREN 09/16/2011, 12:55 PM

## 2016-08-31 ENCOUNTER — Emergency Department: Payer: Self-pay

## 2016-08-31 ENCOUNTER — Emergency Department
Admission: EM | Admit: 2016-08-31 | Discharge: 2016-09-01 | Disposition: A | Discharge status: Home / Self Care | Payer: Self-pay | Attending: Emergency Medicine | Admitting: Emergency Medicine

## 2016-08-31 ENCOUNTER — Encounter: Payer: Self-pay | Admitting: Emergency Medicine

## 2016-08-31 DIAGNOSIS — Y939 Activity, unspecified: Secondary | ICD-10-CM | POA: Insufficient documentation

## 2016-08-31 DIAGNOSIS — Y999 Unspecified external cause status: Secondary | ICD-10-CM | POA: Insufficient documentation

## 2016-08-31 DIAGNOSIS — S42024A Nondisplaced fracture of shaft of right clavicle, initial encounter for closed fracture: Secondary | ICD-10-CM | POA: Insufficient documentation

## 2016-08-31 DIAGNOSIS — Y929 Unspecified place or not applicable: Secondary | ICD-10-CM | POA: Insufficient documentation

## 2016-08-31 DIAGNOSIS — S42001A Fracture of unspecified part of right clavicle, initial encounter for closed fracture: Secondary | ICD-10-CM

## 2016-08-31 DIAGNOSIS — Z7901 Long term (current) use of anticoagulants: Secondary | ICD-10-CM | POA: Insufficient documentation

## 2016-08-31 DIAGNOSIS — F17210 Nicotine dependence, cigarettes, uncomplicated: Secondary | ICD-10-CM | POA: Insufficient documentation

## 2016-08-31 MED ORDER — HYDROCODONE-ACETAMINOPHEN 5-325 MG PO TABS
1.0000 | ORAL_TABLET | Freq: Once | ORAL | Status: AC
  Administered 2016-08-31: 1 via ORAL
  Filled 2016-08-31: qty 1

## 2016-08-31 MED ORDER — OXYCODONE-ACETAMINOPHEN 5-325 MG PO TABS: 1.0000 | ORAL_TABLET | ORAL | 0 refills | Status: AC | PRN
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
## 2016-08-31 NOTE — ED Notes (Signed)
Patient transported to X-ray 

## 2016-08-31 NOTE — ED Notes (Signed)
Pt with approx 2 cm linear laceration to left medial upper eyebrow with controlled bleeding. Pt with cms intact to right fingers, but pt states is not able to move right shoulder. Pt is self splinting right shoulder. Pt denies neck pain. Difficult to assess for deformity to right clavicle due to way pt is self splinting. Pt denies loc. No drainage noted from nose, ears.
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
